# Patient Record
Sex: Male | Born: 1955 | Race: White | Hispanic: No | Marital: Married | Smoking: Never smoker
Health system: Southern US, Community
[De-identification: ages and names within clinical notes are randomized; demographics above are authoritative.]

## PROBLEM LIST (undated history)

## (undated) HISTORY — PX: NO PAST SURGERIES: SHX2092

---

## 2020-03-31 ENCOUNTER — Other Ambulatory Visit: Payer: Self-pay

## 2020-03-31 ENCOUNTER — Ambulatory Visit
Admission: EM | Admit: 2020-03-31 | Discharge: 2020-03-31 | Disposition: A | Discharge status: Home / Self Care | Payer: BC Managed Care – PPO | Attending: Family Medicine | Admitting: Family Medicine

## 2020-03-31 ENCOUNTER — Ambulatory Visit (INDEPENDENT_AMBULATORY_CARE_PROVIDER_SITE_OTHER): Payer: BC Managed Care – PPO

## 2020-03-31 DIAGNOSIS — M25511 Pain in right shoulder: Secondary | ICD-10-CM | POA: Diagnosis not present

## 2020-03-31 DIAGNOSIS — M25541 Pain in joints of right hand: Secondary | ICD-10-CM | POA: Diagnosis not present

## 2020-03-31 DIAGNOSIS — R200 Anesthesia of skin: Secondary | ICD-10-CM

## 2020-03-31 DIAGNOSIS — M25512 Pain in left shoulder: Secondary | ICD-10-CM

## 2020-03-31 DIAGNOSIS — M353 Polymyalgia rheumatica: Secondary | ICD-10-CM | POA: Insufficient documentation

## 2020-03-31 LAB — CBC WITH DIFFERENTIAL/PLATELET
Abs Immature Granulocytes: 0.03 10*3/uL (ref 0.00–0.07)
Basophils Absolute: 0 10*3/uL (ref 0.0–0.1)
Basophils Relative: 1 %
Eosinophils Absolute: 0.4 10*3/uL (ref 0.0–0.5)
Eosinophils Relative: 5 %
HCT: 37.8 % — ABNORMAL LOW (ref 39.0–52.0)
Hemoglobin: 13.3 g/dL (ref 13.0–17.0)
Immature Granulocytes: 0 %
Lymphocytes Relative: 35 %
Lymphs Abs: 2.6 10*3/uL (ref 0.7–4.0)
MCH: 36.1 pg — ABNORMAL HIGH (ref 26.0–34.0)
MCHC: 35.2 g/dL (ref 30.0–36.0)
MCV: 102.7 fL — ABNORMAL HIGH (ref 80.0–100.0)
Monocytes Absolute: 0.8 10*3/uL (ref 0.1–1.0)
Monocytes Relative: 11 %
Neutro Abs: 3.6 10*3/uL (ref 1.7–7.7)
Neutrophils Relative %: 48 %
Platelets: 270 10*3/uL (ref 150–400)
RBC: 3.68 MIL/uL — ABNORMAL LOW (ref 4.22–5.81)
RDW: 12.6 % (ref 11.5–15.5)
WBC: 7.5 10*3/uL (ref 4.0–10.5)
nRBC: 0 % (ref 0.0–0.2)

## 2020-03-31 LAB — COMPREHENSIVE METABOLIC PANEL
ALT: 29 U/L (ref 0–44)
AST: 30 U/L (ref 15–41)
Albumin: 4 g/dL (ref 3.5–5.0)
Alkaline Phosphatase: 86 U/L (ref 38–126)
Anion gap: 8 (ref 5–15)
BUN: 20 mg/dL (ref 8–23)
CO2: 27 mmol/L (ref 22–32)
Calcium: 9.1 mg/dL (ref 8.9–10.3)
Chloride: 101 mmol/L (ref 98–111)
Creatinine, Ser: 0.99 mg/dL (ref 0.61–1.24)
GFR, Estimated: >60 mL/min (ref >60)
Glucose, Bld: 120 mg/dL — ABNORMAL HIGH (ref 70–99)
Potassium: 4.1 mmol/L (ref 3.5–5.1)
Sodium: 136 mmol/L (ref 135–145)
Total Bilirubin: 0.7 mg/dL (ref 0.3–1.2)
Total Protein: 8 g/dL (ref 6.5–8.1)

## 2020-03-31 LAB — SEDIMENTATION RATE: Sed Rate: 37 mm/hr — ABNORMAL HIGH (ref 0–20)

## 2020-03-31 MED ORDER — PREDNISONE 20 MG PO TABS: 20.0000 mg | ORAL_TABLET | Freq: Every day | ORAL | 0 refills | Status: DC

## 2020-03-31 NOTE — ED Provider Notes (Signed)
[facility]    CSN: [id number] Arrival date & time: 03/31/20  6295      History   Chief Complaint Chief Complaint  Patient presents with  . Arm Pain    Bilateral     HPI  64 year old male presents with the above complaint.  Patient reports that he has had ongoing pain of the shoulder girdle and neck. Has been going on for the past year. Worse for the past month. He states that it seems to be worse after he lifted a heavy piece of machinery a month ago. Patient reports significant stiffness. He has difficulty reaching his back pocket and putting on his close. Worse at night. He is having difficulty sleeping. Currently rates his pain as 4/10 in severity. No relieving factors. Additionally, patient reports numbness in the digits of the right hand. Denies fevers, chills, weight loss. No other reported symptoms. No other complaints.  Home Medications    Prior to Admission medications   Medication Sig Start Date End Date Taking? Authorizing Provider  predniSONE (DELTASONE) 20 MG tablet Take 1 tablet (20 mg total) by mouth daily with breakfast. 03/31/20   Coral Spikes, DO    Family History Family History  Problem Relation Age of Onset  . Parkinson's disease Mother   . Lymphoma Mother   . Heart attack Father     Social History Social History   Tobacco Use  . Smoking status: Never Smoker  . Smokeless tobacco: Never Used  Vaping Use  . Vaping Use: Never used  Substance Use Topics  . Alcohol use: Never  . Drug use: Never     Allergies   Patient has no known allergies.   Review of Systems Review of Systems  Constitutional: Negative.   Musculoskeletal: Positive for arthralgias and neck pain.   Physical Exam Triage Vital Signs ED Triage Vitals  Enc Vitals Group     BP 03/31/20 1918 131/82     Pulse Rate 03/31/20 1918 82     Resp 03/31/20 1918 18     Temp 03/31/20 1918 98.6 F (37 C)     Temp Source 03/31/20 1918 Oral     SpO2 03/31/20 1918 99 %      Weight 03/31/20 1915 200 lb (90.7 kg)     Height 03/31/20 1915 $RemoveBefor'5\' 6"'wJYLXpWDAdfg$  (1.676 m)     Head Circumference --      Peak Flow --      Pain Score 03/31/20 1915 4     Pain Loc --      Pain Edu? --      Excl. in [place]? --    Updated Vital Signs BP 131/82 (BP Location: Left Arm)   Pulse 82   Temp 98.6 F (37 C) (Oral)   Resp 18   Ht $R'5\' 6"'yq$  (1.676 m)   Wt 90.7 kg   SpO2 99%   BMI 32.28 kg/m   Visual Acuity Right Eye Distance:   Left Eye Distance:   Bilateral Distance:    Right Eye Near:   Left Eye Near:    Bilateral Near:     Physical Exam Constitutional:      General: He is not in acute distress.    Appearance: Normal appearance. He is not ill-appearing.  HENT:     Head: Normocephalic and atraumatic.  Eyes:     General:        Right eye: No discharge.        Left eye: No discharge.  Conjunctiva/sclera: Conjunctivae normal.  Cardiovascular:     Rate and Rhythm: Normal rate and regular rhythm.     Heart sounds: No murmur heard.   Pulmonary:     Effort: Pulmonary effort is normal.     Breath sounds: Normal breath sounds. No wheezing, rhonchi or rales.  Musculoskeletal:     Comments: Shoulder: Left Inspection reveals no abnormalities, atrophy or asymmetry. Palpation is normal with no tenderness over AC joint or bicipital groove. Rotator cuff strength normal.  Decreased ROM in flexion.  Shoulder: Right Inspection reveals no abnormalities, atrophy or asymmetry. Palpation is normal with no tenderness over AC joint or bicipital groove. ROM decreased in in flexion. Rotator cuff strength 4/5 infraspinatus/teres minor.    Skin:    Comments: Forearms with dry skin and suspected actinic keratosis.   Neurological:     Mental Status: He is alert.  Psychiatric:        Mood and Affect: Mood normal.        Behavior: Behavior normal.      UC Treatments / Results  Labs (all labs ordered are listed, but only abnormal results are displayed) Labs Reviewed  CBC WITH  DIFFERENTIAL/PLATELET - Abnormal; Notable for the following components:      Result Value   RBC 3.68 (*)    HCT 37.8 (*)    MCV 102.7 (*)    MCH 36.1 (*)    All other components within normal limits  COMPREHENSIVE METABOLIC PANEL - Abnormal; Notable for the following components:   Glucose, Bld 120 (*)    All other components within normal limits  SEDIMENTATION RATE - Abnormal; Notable for the following components:   Sed Rate 37 (*)    All other components within normal limits  C-REACTIVE PROTEIN - Abnormal; Notable for the following components:   CRP 1.3 (*)    All other components within normal limits    EKG   Radiology DG Cervical Spine Complete  Result Date: 03/31/2020 CLINICAL DATA:  Pain and numbness of right hand. EXAM: CERVICAL SPINE - COMPLETE 4+ VIEW COMPARISON:  None. FINDINGS: On the lateral view the cervical spine is visualized to the level of C5. There is slight straightening of the normal cervical lordosis likely due to positioning. Alignment is otherwise normal. Dens is well positioned between the lateral masses of C1. There is limited evaluation of the dens for acute fracture on the open-mouth view due to overlying osseous structures. No acute displaced fracture is detected. Multilevel mild osteophyte formation. No severe osseous neural foraminal stenosis noted on oblique views. Cervical disc heights are preserved.No aggressive-appearing focal osseous lesions. Pre-vertebral soft tissues are within normal limits. IMPRESSION: 1. No acute displaced fracture or traumatic listhesis of the cervical spine. Please note partial nonvisualization of the C6 and C7 levels on lateral view. 2. No severe osseous neural foraminal stenosis bilaterally. Electronically Signed   By: Iven Finn M.D.   On: 03/31/2020 20:25   DG Shoulder Right  Result Date: 03/31/2020 CLINICAL DATA:  Bilateral shoulder pain.  No known injury. EXAM: RIGHT SHOULDER - 2+ VIEW; LEFT SHOULDER - 2+ VIEW  COMPARISON:  None. FINDINGS: There is no acute bony or joint abnormality the right or left. The glenohumeral joints appear normal. 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 Soft tissues are negative. 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 Otherwise negative. Electronically Signed   By: Inge Rise M.D.   On: 03/31/2020 20:11   DG Shoulder Left  Result Date: 03/31/2020 CLINICAL DATA:  Bilateral shoulder pain.  No known injury. EXAM: RIGHT SHOULDER - 2+ VIEW; LEFT SHOULDER - 2+ VIEW COMPARISON:  None. FINDINGS: There is no acute bony or joint abnormality the right or left. The glenohumeral joints appear normal. 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 Soft tissues are negative. 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 Otherwise negative. Electronically Signed   By: Inge Rise M.D.   On: 03/31/2020 20:11    Procedures Procedures (including critical care time)  Medications Ordered in UC Medications - No data to display  Initial Impression / Assessment and Plan / UC Course  I have reviewed the triage vital signs and the nursing notes.  Pertinent labs & imaging results that were available during my care of the patient were reviewed by me and considered in my medical decision making (see chart for details).    64 year old male presents with suspected PMR.  Elevated ESR at 37.  X-rays were obtained and were independently reviewed by me.  He has AC joint arthritis bilaterally.  Degenerative changes of cervical spine.  No other abnormalities.  Placing on prednisone.  Patient needs close primary care follow-up.  Will need to see rheumatology as well.  Final Clinical Impressions(s) / UC Diagnoses   Final diagnoses:  Polymyalgia rheumatica ([place])     Discharge Instructions     Suspected polymyalgia.  Medication as prescribed.  Follow  up with PCP.  Take care  Dr. Lacinda Axon     ED Prescriptions    Medication Sig Dispense Auth. Provider   predniSONE (DELTASONE) 20 MG tablet Take 1 tablet (20 mg total) by mouth daily with breakfast. 30 tablet Coral Spikes, DO     PDMP not reviewed this encounter.   Coral Spikes, [place] 04/01/20 [phone]

## 2020-03-31 NOTE — ED Triage Notes (Signed)
Patient states that has been having bilateral arm pain that started after lifting a heavy piece of machinery. States that he has pain in his shoulders and back of his neck. States that he is sitting up to sleep now and pain has been excruciating.

## 2020-03-31 NOTE — Discharge Instructions (Signed)
Suspected polymyalgia.  Medication as prescribed.  Follow up with PCP.  Take care  Dr. Adriana Simas

## 2020-04-01 LAB — C-REACTIVE PROTEIN: CRP: 1.3 mg/dL — ABNORMAL HIGH (ref <1.0)

## 2020-04-21 ENCOUNTER — Other Ambulatory Visit: Payer: Self-pay | Admitting: Family Medicine

## 2020-05-02 ENCOUNTER — Ambulatory Visit: Payer: Self-pay | Admitting: Family Medicine

## 2020-05-05 ENCOUNTER — Ambulatory Visit: Payer: BC Managed Care – PPO | Admitting: Family Medicine

## 2020-05-16 ENCOUNTER — Ambulatory Visit: Payer: BC Managed Care – PPO | Admitting: Adult Health

## 2020-08-08 ENCOUNTER — Other Ambulatory Visit: Payer: Self-pay | Admitting: Physical Medicine & Rehabilitation

## 2020-08-08 DIAGNOSIS — M542 Cervicalgia: Secondary | ICD-10-CM

## 2020-08-17 ENCOUNTER — Other Ambulatory Visit: Payer: Self-pay

## 2020-08-17 ENCOUNTER — Ambulatory Visit
Admission: RE | Admit: 2020-08-17 | Discharge: 2020-08-17 | Disposition: A | Discharge status: Home / Self Care | Payer: BC Managed Care – PPO | Source: Ambulatory Visit | Attending: Physical Medicine & Rehabilitation | Admitting: Physical Medicine & Rehabilitation

## 2020-08-17 DIAGNOSIS — M542 Cervicalgia: Secondary | ICD-10-CM | POA: Insufficient documentation

## 2020-08-23 ENCOUNTER — Other Ambulatory Visit: Payer: Self-pay | Admitting: Physical Medicine & Rehabilitation

## 2020-08-23 DIAGNOSIS — M542 Cervicalgia: Secondary | ICD-10-CM

## 2020-09-04 ENCOUNTER — Other Ambulatory Visit: Payer: Self-pay

## 2020-09-04 ENCOUNTER — Ambulatory Visit
Admission: RE | Admit: 2020-09-04 | Discharge: 2020-09-04 | Disposition: A | Discharge status: Home / Self Care | Payer: BC Managed Care – PPO | Source: Ambulatory Visit | Attending: Physical Medicine & Rehabilitation | Admitting: Physical Medicine & Rehabilitation

## 2020-09-04 DIAGNOSIS — M542 Cervicalgia: Secondary | ICD-10-CM

## 2020-09-04 MED ORDER — TRIAMCINOLONE ACETONIDE 40 MG/ML IJ SUSP (RADIOLOGY)
60.0000 mg | Freq: Once | INTRAMUSCULAR | Status: AC
  Administered 2020-09-04: 60 mg via EPIDURAL

## 2020-09-04 MED ORDER — IOPAMIDOL (ISOVUE-M 300) INJECTION 61%
1.0000 mL | Freq: Once | INTRAMUSCULAR | Status: AC
  Administered 2020-09-04: 1 mL via EPIDURAL

## 2020-09-04 NOTE — Discharge Instructions (Signed)

## 2021-04-04 ENCOUNTER — Other Ambulatory Visit: Payer: Self-pay

## 2021-04-04 ENCOUNTER — Ambulatory Visit
Admission: EM | Admit: 2021-04-04 | Discharge: 2021-04-04 | Disposition: A | Discharge status: Home / Self Care | Payer: BC Managed Care – PPO

## 2021-04-04 DIAGNOSIS — J069 Acute upper respiratory infection, unspecified: Secondary | ICD-10-CM | POA: Diagnosis not present

## 2021-04-04 MED ORDER — IPRATROPIUM BROMIDE 0.06 % NA SOLN: 2.0000 | Freq: Four times a day (QID) | NASAL | 12 refills | Status: AC

## 2021-04-04 MED ORDER — BENZONATATE 100 MG PO CAPS: 200.0000 mg | ORAL_CAPSULE | Freq: Three times a day (TID) | ORAL | 0 refills | Status: AC

## 2021-04-04 MED ORDER — PROMETHAZINE-DM 6.25-15 MG/5ML PO SYRP: 5.0000 mL | ORAL_SOLUTION | Freq: Four times a day (QID) | ORAL | 0 refills | Status: AC | PRN

## 2021-04-04 NOTE — ED Triage Notes (Signed)
Pt presents with nasal congestion, cough x 4 days.  Also reports HA.

## 2021-04-04 NOTE — ED Provider Notes (Signed)
[facility]    CSN: [id number] Arrival date & time: 04/04/21  0857      History   Chief Complaint Chief Complaint  Patient presents with   Cough    HPI Leemon Shabani is a 65 y.o. male.   HPI  65 year old male here for evaluation of respiratory symptoms.  Patient reports of last 4 days he has been experiencing nasal congestion with yellow nasal discharge and sinus pressure, productive cough for yellow sputum that is worse at night, and his wife indicates that he has had some shortness of breath or wheezing.  When asked directly patient initially denied those symptoms.  Patient is not in respiratory distress and does not demonstrate tachypnea or dyspnea.  He is able to speak in full sentences.  No wheezing appreciated during history taking the exam.  Patient denies fever, ear pain, or GI complaints.   History reviewed. No pertinent past medical history.  There are no problems to display for this patient.   Past Surgical History:  Procedure Laterality Date   NO PAST SURGERIES         Home Medications    Prior to Admission medications   Medication Sig Start Date End Date Taking? Authorizing Provider  ascorbic acid (VITAMIN C) 1000 MG tablet Take 1,000 mg by mouth daily.   Yes [provider]  aspirin EC 81 MG tablet Take 81 mg by mouth daily. Swallow whole.   Yes [provider]  benzonatate (TESSALON) 100 MG capsule Take 2 capsules (200 mg total) by mouth every 8 (eight) hours. 04/04/21  Yes Margarette Canada, NP  Cholecalciferol (D3-50 PO) Take by mouth.   Yes [provider]  ipratropium (ATROVENT) 0.06 % nasal spray Place 2 sprays into both nostrils 4 (four) times daily. 04/04/21  Yes Margarette Canada, NP  latanoprost (XALATAN) 0.005 % ophthalmic solution 1 drop at bedtime.   Yes [provider]  promethazine-dextromethorphan (PROMETHAZINE-DM) 6.25-15 MG/5ML syrup Take 5 mLs by mouth 4 (four) times daily as needed. 04/04/21  Yes  Margarette Canada, NP  zinc gluconate 50 MG tablet Take 50 mg by mouth daily.   Yes [provider]    Family History Family History  Problem Relation Age of Onset   Parkinson's disease Mother    Lymphoma Mother    Heart attack Father     Social History Social History   Tobacco Use   Smoking status: Never   Smokeless tobacco: Never  Vaping Use   Vaping Use: Never used  Substance Use Topics   Alcohol use: Never   Drug use: Never     Allergies   Patient has no known allergies.   Review of Systems Review of Systems  Constitutional:  Negative for activity change, appetite change and fever.  HENT:  Positive for congestion, rhinorrhea, sinus pressure and sore throat. Negative for ear pain.   Respiratory:  Positive for cough, shortness of breath and wheezing.   Gastrointestinal:  Negative for diarrhea, nausea and vomiting.  Skin:  Negative for rash.  Hematological: Negative.   Psychiatric/Behavioral: Negative.      Physical Exam Triage Vital Signs ED Triage Vitals  Enc Vitals Group     BP 04/04/21 1034 127/78     Pulse Rate 04/04/21 1034 66     Resp 04/04/21 1034 20     Temp 04/04/21 1034 98.6 F (37 C)     Temp Source 04/04/21 1034 Oral     SpO2 04/04/21 1034 99 %  Weight --      Height --      Head Circumference --      Peak Flow --      Pain Score 04/04/21 1037 0     Pain Loc --      Pain Edu? --      Excl. in [place]? --    No data found.  Updated Vital Signs BP 127/78 (BP Location: Left Arm)    Pulse 66    Temp 98.6 F (37 C) (Oral)    Resp 20    SpO2 99%   Visual Acuity Right Eye Distance:   Left Eye Distance:   Bilateral Distance:    Right Eye Near:   Left Eye Near:    Bilateral Near:     Physical Exam Vitals and nursing note reviewed.  Constitutional:      General: He is not in acute distress.    Appearance: Normal appearance. He is not ill-appearing.  HENT:     Head: Normocephalic and atraumatic.     Right Ear: Tympanic membrane,  ear canal and external ear normal. There is no impacted cerumen.     Left Ear: Tympanic membrane, ear canal and external ear normal. There is no impacted cerumen.     Nose: Congestion and rhinorrhea present.     Mouth/Throat:     Mouth: Mucous membranes are moist.     Pharynx: Oropharynx is clear. Posterior oropharyngeal erythema present.  Cardiovascular:     Rate and Rhythm: Normal rate and regular rhythm.     Pulses: Normal pulses.     Heart sounds: Normal heart sounds. No murmur heard.   No gallop.  Pulmonary:     Effort: Pulmonary effort is normal.     Breath sounds: Normal breath sounds. No wheezing, rhonchi or rales.  Musculoskeletal:     Cervical back: Normal range of motion and neck supple.  Lymphadenopathy:     Cervical: No cervical adenopathy.  Skin:    General: Skin is warm and dry.     Capillary Refill: Capillary refill takes less than 2 seconds.     Findings: No erythema or rash.  Neurological:     General: No focal deficit present.     Mental Status: He is alert and oriented to person, place, and time.  Psychiatric:        Mood and Affect: Mood normal.        Behavior: Behavior normal.        Thought Content: Thought content normal.        Judgment: Judgment normal.     UC Treatments / Results  Labs (all labs ordered are listed, but only abnormal results are displayed) Labs Reviewed - No data to display  EKG   Radiology No results found.  Procedures Procedures (including critical care time)  Medications Ordered in UC Medications - No data to display  Initial Impression / Assessment and Plan / UC Course  I have reviewed the triage vital signs and the nursing notes.  Pertinent labs & imaging results that were available during my care of the patient were reviewed by me and considered in my medical decision making (see chart for details).  Patient is a nontoxic-appearing 65 year old male here for evaluation of respiratory complaints as outlined in HPI  above.  Per the patient's wife he exhibits shortness of breath and wheezing but that has not demonstrated during his physical exam.On physical exam patient has pearly-gray tympanic membranes bilaterally with normal light  reflex and clear external auditory canals.  Nasal mucosa is erythematous and edematous with clear nasal discharge in both nares.  Oropharyngeal exam reveals mild posterior oropharyngeal erythema with clear postnasal drip.  No cervical lymphadenopathy appreciated exam.  Cardiopulmonary exam reveals clear lung sounds in all fields.  Patient exam is consistent with a viral URI with a cough.  Given the lack of fever I do not suspect influenza and he is on day 4 of symptoms so is outside the window for antiviral therapy.  I would not test him at this time.  I will treat him symptomatically with Atrovent nasal spray for nasal congestion and sinus pressure, Tessalon Perles for cough during the day, and Promethazine DM cough syrup use at nighttime only.   Final Clinical Impressions(s) / UC Diagnoses   Final diagnoses:  Viral URI with cough     Discharge Instructions      Use the Atrovent nasal spray, 2 squirts in each nostril every 6 hours, as needed for runny nose and postnasal drip.  Use the Tessalon Perles every 8 hours during the day.  Take them with a small sip of water.  They may give you some numbness to the base of your tongue or a metallic taste in your mouth, this is normal.  Use the Promethazine DM cough syrup at bedtime for cough and congestion.  It will make you drowsy so do not take it during the day.  Return for reevaluation or see your primary care provider for any new or worsening symptoms.      ED Prescriptions     Medication Sig Dispense Auth. Provider   benzonatate (TESSALON) 100 MG capsule Take 2 capsules (200 mg total) by mouth every 8 (eight) hours. 21 capsule Becky Augusta, NP   ipratropium (ATROVENT) 0.06 % nasal spray Place 2 sprays into both nostrils 4  (four) times daily. 15 mL Becky Augusta, NP   promethazine-dextromethorphan (PROMETHAZINE-DM) 6.25-15 MG/5ML syrup Take 5 mLs by mouth 4 (four) times daily as needed. 118 mL Becky Augusta, NP      PDMP not reviewed this encounter.   Becky Augusta, NP 04/04/21 1108

## 2021-04-04 NOTE — Discharge Instructions (Signed)

## 2021-09-05 IMAGING — MR MR CERVICAL SPINE W/O CM
5 series · 35 of 48 positions shown · non-contrast
Comparison: Cervical spine radiographs 03/31/2020.

CLINICAL DATA: Cervicalgia. Additional history provided by scanning
technologist: Patient reports 18 months of bilateral neck pain,
right greater than left arm pain, right hand numbness/tingling.

EXAM:
MRI CERVICAL SPINE WITHOUT CONTRAST
TECHNIQUE: Multiplanar, multisequence MR imaging of the cervical spine was
performed. No intravenous contrast was administered.

[Series 5: T2 · sagittal · 3.0mm · 0.62mm/px · 6 of 15 slices shown (1 of 2)]
[im 1/15]
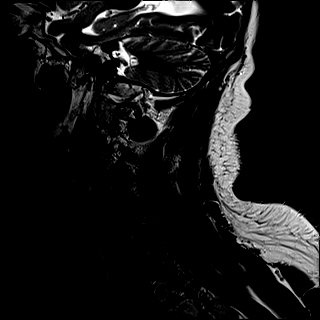
[im 3/15]
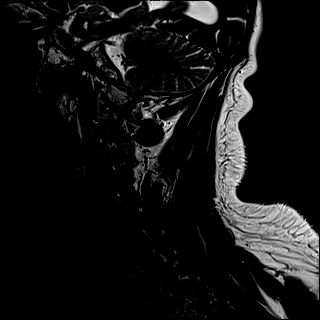
[im 6/15]
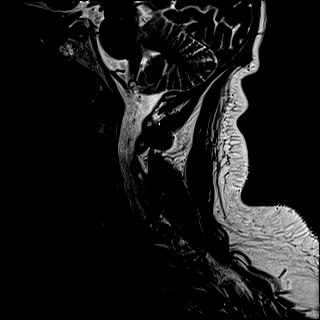
[im 9/15]
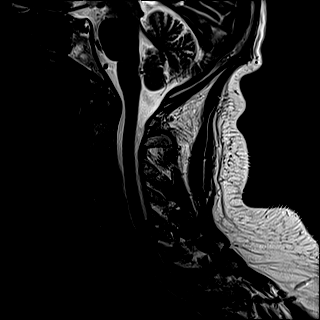
[im 12/15]
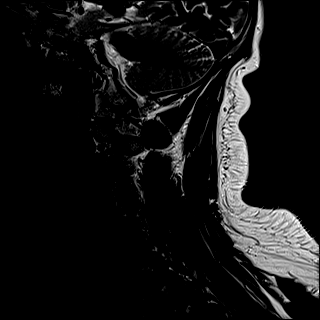
[im 15/15]
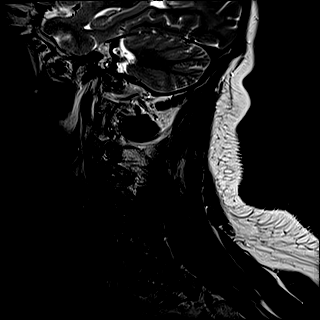

[Series 6: FLAIR · sagittal · 3.0mm · 0.78mm/px · 7 of 15 slices shown]
[im 1/15]
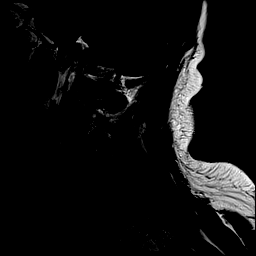
[im 3/15]
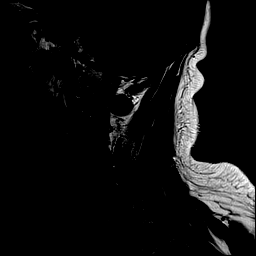
[im 5/15]
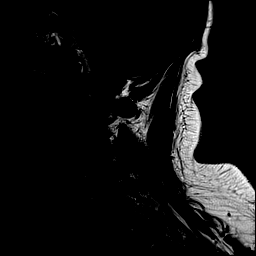
[im 8/15]
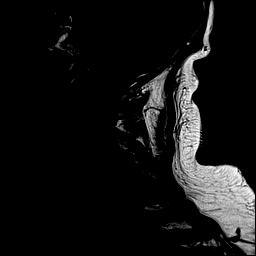
[im 10/15]
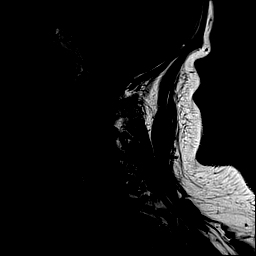
[im 12/15]
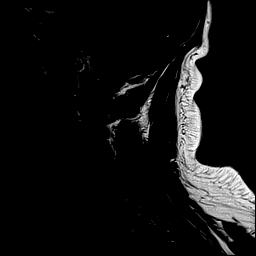
[im 15/15]
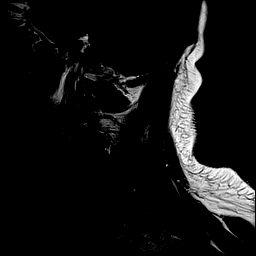

[Series 7: STIR · sagittal · 3.0mm · 0.62mm/px · 7 of 15 slices shown]
[im 1/15]
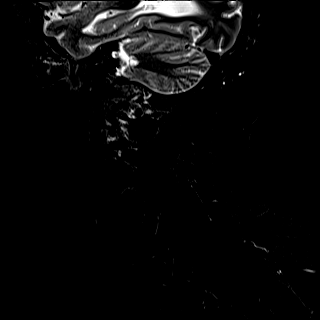
[im 3/15]
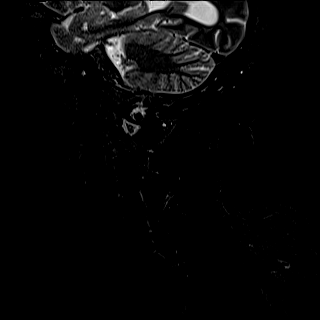
[im 5/15]
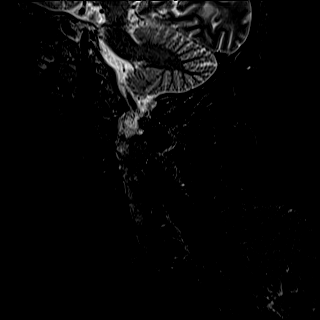
[im 8/15]
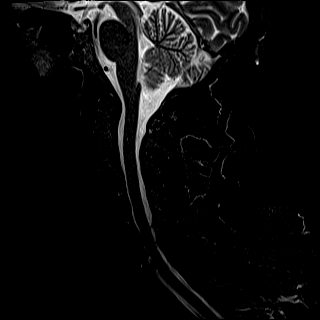
[im 10/15]
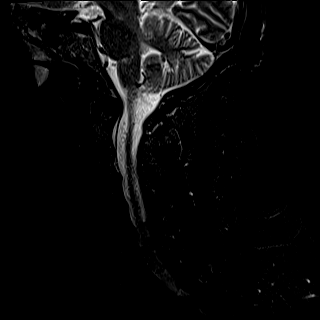
[im 12/15]
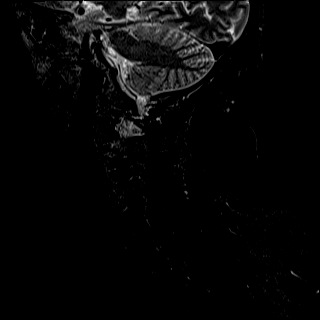
[im 15/15]
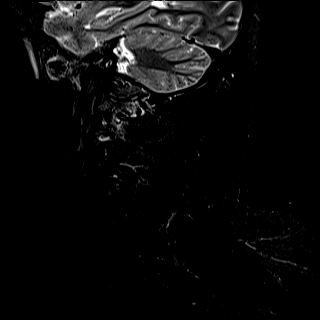

[Series 8: T2 · axial · 3.0mm · 0.70mm/px · z∈[-92,+4]mm · 8 of 30 slices shown (2 of 2)]
[im 1/30]
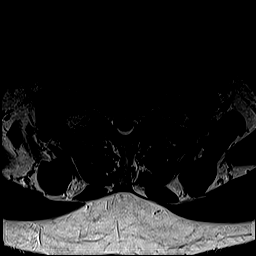
[im 5/30]
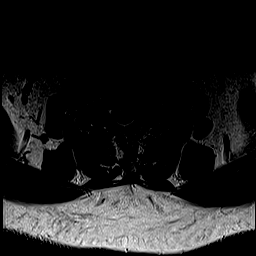
[im 9/30]
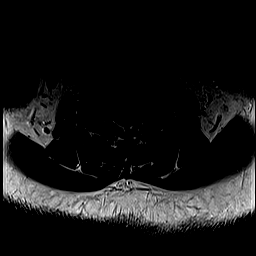
[im 14/30]
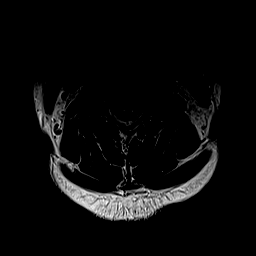
[im 16/30]
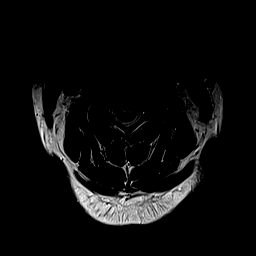
[im 21/30]
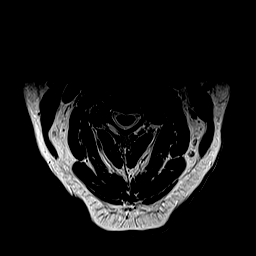
[im 25/30]
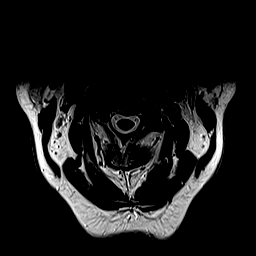
[im 30/30]
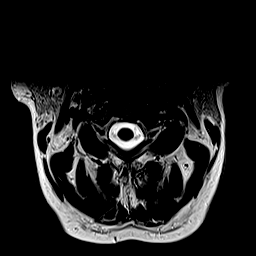

[Series 9: ax mpgr · axial · 3.0mm · 0.35mm/px · z∈[-92,-12]mm · 7 of 30 slices shown]
[im 1/30]
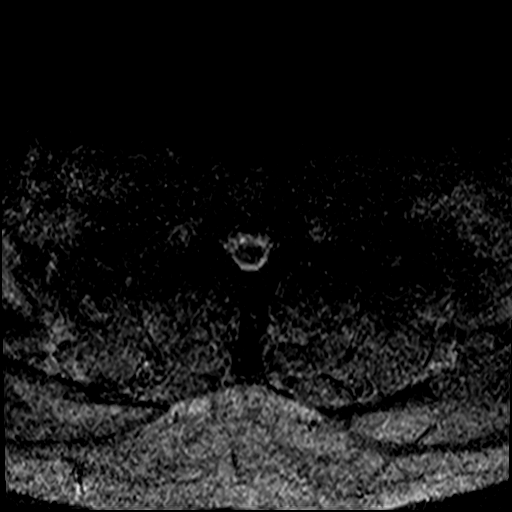
[im 5/30]
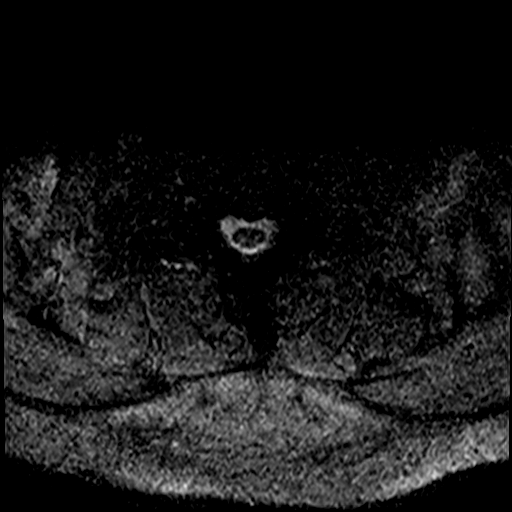
[im 9/30]
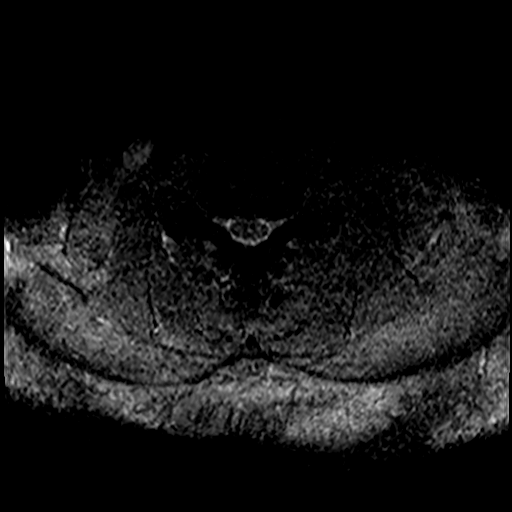
[im 14/30]
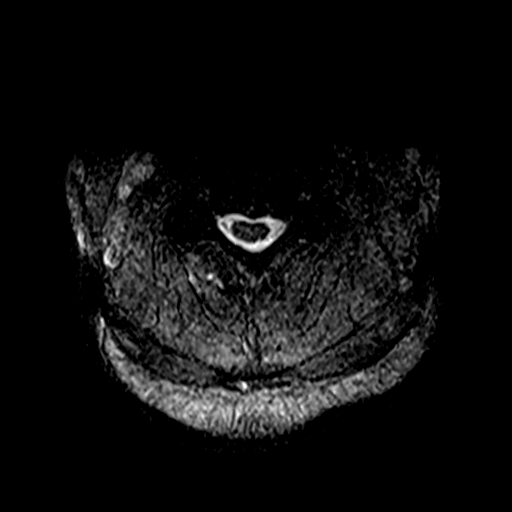
[im 16/30]
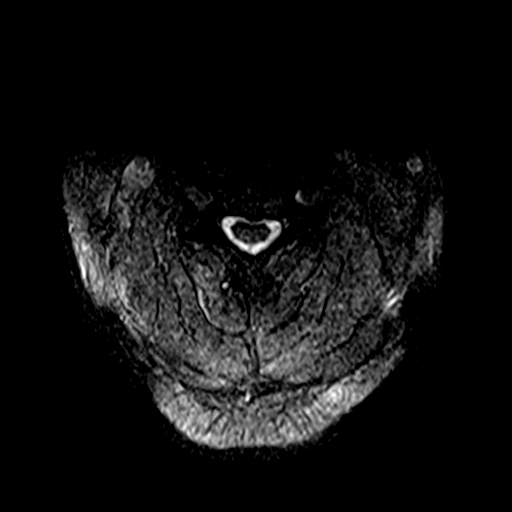
[im 21/30]
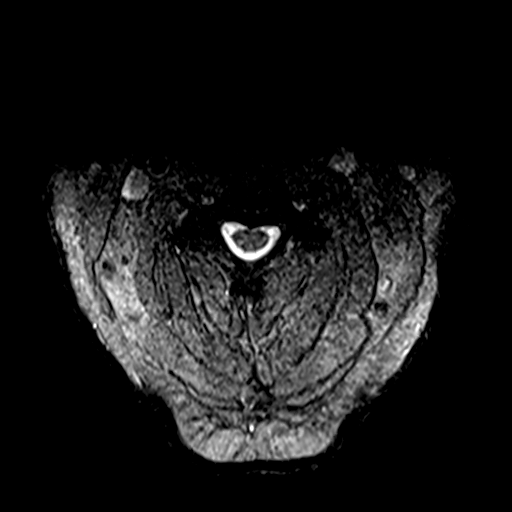
[im 25/30]
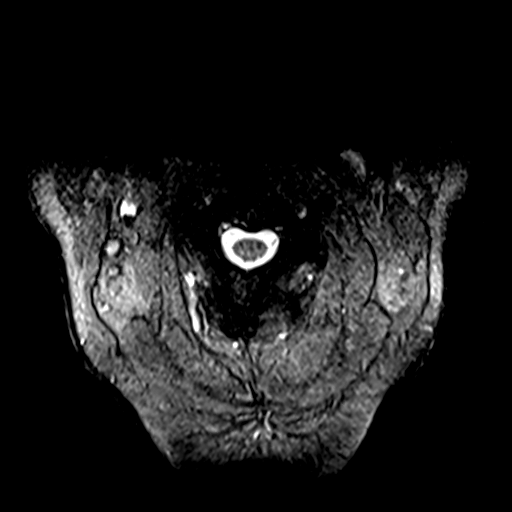

[35 of 48 positions shown; findings below may reference images not displayed]

FINDINGS: Alignment: 2 mm C6-C7 grade 1 retrolisthesis.

Vertebrae: Vertebral body height is maintained. No significant
marrow edema or focal suspicious osseous lesion.

Cord: No spinal cord signal abnormality.

Posterior Fossa, vertebral arteries, paraspinal tissues: No acute
finding within the visible posterior fossa. Flow voids preserved
within the imaged cervical vertebral arteries. Paraspinal soft
tissues within normal limits.

Disc levels:

Moderate disc degeneration at C5-C6 and C6-C7. No more than mild
disc degeneration at the remaining levels.

C2-C3: No significant disc herniation or stenosis.

C3-C4: Shallow disc bulge. Uncovertebral hypertrophy and facet
arthrosis (greater on the right). No significant spinal canal
stenosis. Moderate/severe right neural foraminal narrowing.

C4-C5: Shallow disc bulge with superimposed tiny central disc
protrusion. Minimal uncovertebral hypertrophy. Facet arthrosis. No
significant spinal canal stenosis. Mild/moderate bilateral neural
foraminal narrowing.

C5-C6: Disc bulge with endplate spurring. Bilateral disc osteophyte
ridge/uncinate hypertrophy. Facet arthrosis with (greater on the
left) with mild ligamentum flavum hypertrophy. Mild spinal canal
stenosis without significant spinal cord mass effect. Bilateral
neural foraminal narrowing (moderate/severe right, severe left).

C6-C7: Disc bulge with superimposed central disc protrusion.
Bilateral disc osteophyte ridge/uncinate hypertrophy. Mild facet
arthrosis and ligamentum flavum hypertrophy. Moderate spinal canal
stenosis with contact upon the dorsal and ventral spinal cord.
Severe bilateral neural foraminal narrowing.

C7-T1: No significant disc herniation or spinal canal stenosis.
Facet arthrosis (greater on the left) bilateral neural foraminal
narrowing (mild right, moderate left).
IMPRESSION: Cervical spondylosis as outlined and with findings most notably as
follows.

At C6-C7, there is moderate disc degeneration. Multifactorial
moderate spinal canal stenosis with contact upon the dorsal and
ventral spinal cord. Severe bilateral neural foraminal narrowing.

At C5-C6, there is moderate disc degeneration. Multifactorial mild
spinal canal stenosis without significant spinal cord mass effect.
Bilateral neural foraminal narrowing (moderate/severe right, severe
left).

No significant spinal canal stenosis at the remaining levels.
Additional sites of neural foraminal narrowing as described and
greatest on the right at C3-C4 (moderate/severe at this site).

2 mm C6-C7 grade 1 retrolisthesis.

## 2023-05-22 DIAGNOSIS — H401111 Primary open-angle glaucoma, right eye, mild stage: Secondary | ICD-10-CM | POA: Diagnosis not present

## 2023-05-22 DIAGNOSIS — H401123 Primary open-angle glaucoma, left eye, severe stage: Secondary | ICD-10-CM | POA: Diagnosis not present

## 2023-05-22 DIAGNOSIS — H2513 Age-related nuclear cataract, bilateral: Secondary | ICD-10-CM | POA: Diagnosis not present

## 2023-11-18 DIAGNOSIS — H401111 Primary open-angle glaucoma, right eye, mild stage: Secondary | ICD-10-CM | POA: Diagnosis not present

## 2023-11-18 DIAGNOSIS — H401123 Primary open-angle glaucoma, left eye, severe stage: Secondary | ICD-10-CM | POA: Diagnosis not present

## 2023-11-27 DIAGNOSIS — H2513 Age-related nuclear cataract, bilateral: Secondary | ICD-10-CM | POA: Diagnosis not present

## 2023-11-27 DIAGNOSIS — H401131 Primary open-angle glaucoma, bilateral, mild stage: Secondary | ICD-10-CM | POA: Diagnosis not present
